# Patient Record
Sex: Female | Born: 1995 | Race: White | Hispanic: No | Marital: Single | Smoking: Never smoker
Health system: Southern US, Community
[De-identification: ages and names within clinical notes are randomized; demographics above are authoritative.]

## PROBLEM LIST (undated history)

## (undated) DIAGNOSIS — R4020 Unspecified coma: Secondary | ICD-10-CM

## (undated) HISTORY — PX: NO PAST SURGERIES: SHX2092

## (undated) HISTORY — PX: DILATION AND CURETTAGE, DIAGNOSTIC / THERAPEUTIC: SUR384

## (undated) HISTORY — DX: Unspecified coma: R40.20

---
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
## 2018-08-10 DIAGNOSIS — Z202 Contact with and (suspected) exposure to infections with a predominantly sexual mode of transmission: Secondary | ICD-10-CM | POA: Diagnosis not present

## 2018-08-25 DIAGNOSIS — J029 Acute pharyngitis, unspecified: Secondary | ICD-10-CM | POA: Diagnosis not present

## 2019-02-13 DIAGNOSIS — N390 Urinary tract infection, site not specified: Secondary | ICD-10-CM | POA: Diagnosis not present

## 2019-02-13 DIAGNOSIS — N941 Unspecified dyspareunia: Secondary | ICD-10-CM | POA: Diagnosis not present

## 2019-03-06 ENCOUNTER — Ambulatory Visit: Payer: BC Managed Care – PPO | Attending: Internal Medicine

## 2019-03-06 DIAGNOSIS — Z20822 Contact with and (suspected) exposure to covid-19: Secondary | ICD-10-CM

## 2019-03-07 LAB — NOVEL CORONAVIRUS, NAA: SARS-CoV-2, NAA: DETECTED — AB

## 2019-03-20 ENCOUNTER — Inpatient Hospital Stay (HOSPITAL_COMMUNITY)
Admission: AD | Admit: 2019-03-20 | Discharge: 2019-03-20 | Disposition: A | Discharge status: Home / Self Care | Payer: BC Managed Care – PPO | Attending: Obstetrics and Gynecology | Admitting: Obstetrics and Gynecology

## 2019-03-20 ENCOUNTER — Other Ambulatory Visit: Payer: Self-pay

## 2019-03-20 ENCOUNTER — Encounter (HOSPITAL_COMMUNITY): Payer: Self-pay | Admitting: Obstetrics and Gynecology

## 2019-03-20 ENCOUNTER — Inpatient Hospital Stay (HOSPITAL_COMMUNITY): Payer: BC Managed Care – PPO

## 2019-03-20 DIAGNOSIS — Z3201 Encounter for pregnancy test, result positive: Secondary | ICD-10-CM | POA: Diagnosis not present

## 2019-03-20 DIAGNOSIS — O99891 Other specified diseases and conditions complicating pregnancy: Secondary | ICD-10-CM | POA: Diagnosis not present

## 2019-03-20 DIAGNOSIS — T7421XA Adult sexual abuse, confirmed, initial encounter: Secondary | ICD-10-CM

## 2019-03-20 DIAGNOSIS — O26891 Other specified pregnancy related conditions, first trimester: Secondary | ICD-10-CM

## 2019-03-20 DIAGNOSIS — R109 Unspecified abdominal pain: Secondary | ICD-10-CM | POA: Diagnosis not present

## 2019-03-20 DIAGNOSIS — O219 Vomiting of pregnancy, unspecified: Secondary | ICD-10-CM | POA: Insufficient documentation

## 2019-03-20 DIAGNOSIS — R1033 Periumbilical pain: Secondary | ICD-10-CM | POA: Diagnosis not present

## 2019-03-20 DIAGNOSIS — N926 Irregular menstruation, unspecified: Secondary | ICD-10-CM | POA: Diagnosis not present

## 2019-03-20 DIAGNOSIS — Z3A08 8 weeks gestation of pregnancy: Secondary | ICD-10-CM | POA: Insufficient documentation

## 2019-03-20 DIAGNOSIS — Z3491 Encounter for supervision of normal pregnancy, unspecified, first trimester: Secondary | ICD-10-CM

## 2019-03-20 DIAGNOSIS — O9A411 Sexual abuse complicating pregnancy, first trimester: Secondary | ICD-10-CM | POA: Diagnosis not present

## 2019-03-20 LAB — CBC
HCT: 34.7 % — ABNORMAL LOW (ref 36.0–46.0)
Hemoglobin: 11.4 g/dL — ABNORMAL LOW (ref 12.0–15.0)
MCH: 28 pg (ref 26.0–34.0)
MCHC: 32.9 g/dL (ref 30.0–36.0)
MCV: 85.3 fL (ref 80.0–100.0)
Platelets: 220 10*3/uL (ref 150–400)
RBC: 4.07 MIL/uL (ref 3.87–5.11)
RDW: 13.4 % (ref 11.5–15.5)
WBC: 7.8 10*3/uL (ref 4.0–10.5)
nRBC: 0 % (ref 0.0–0.2)

## 2019-03-20 LAB — URINALYSIS, ROUTINE W REFLEX MICROSCOPIC
Bilirubin Urine: NEGATIVE
Glucose, UA: NEGATIVE mg/dL
Hgb urine dipstick: NEGATIVE
Ketones, ur: NEGATIVE mg/dL
Nitrite: NEGATIVE
Protein, ur: NEGATIVE mg/dL
Specific Gravity, Urine: 1.015 (ref 1.005–1.030)
pH: 6 (ref 5.0–8.0)

## 2019-03-20 LAB — ABO/RH: ABO/RH(D): O POS

## 2019-03-20 LAB — POCT PREGNANCY, URINE: Preg Test, Ur: POSITIVE — AB

## 2019-03-20 LAB — WET PREP, GENITAL
Clue Cells Wet Prep HPF POC: NONE SEEN
Sperm: NONE SEEN
Trich, Wet Prep: NONE SEEN
Yeast Wet Prep HPF POC: NONE SEEN

## 2019-03-20 LAB — HCG, QUANTITATIVE, PREGNANCY: hCG, Beta Chain, Quant, S: 79667 m[IU]/mL — ABNORMAL HIGH (ref <5)

## 2019-03-20 LAB — HIV ANTIBODY (ROUTINE TESTING W REFLEX): HIV Screen 4th Generation wRfx: NONREACTIVE

## 2019-03-20 MED ORDER — ONDANSETRON HCL 4 MG PO TABS: 4.0000 mg | ORAL_TABLET | Freq: Three times a day (TID) | ORAL | 0 refills | Status: DC | PRN

## 2019-03-20 NOTE — MAU Note (Signed)
Pt presents to MAU with c/o lower abdominal pain that started x 1 week ago. She is also complaining of nausea. LMP-01/21/2019. She was [employer] in clinic and had +HPT today.

## 2019-03-20 NOTE — Discharge Instructions (Signed)
[place] for [employer] at [facility]       Phone: [phone]  [facility] for [employer] at [facility]   Phone: [place] for [employer] at [place]  Phone: [place] for [employer] at [facility]  Phone: [place] for [employer] at [place]  Phone: [place] for [place] at [facility]   Phone: [facility]       Phone: [phone]  [facility]    Phone: [phone]   [facility]    Phone: [phone]  [facility]    Phone: [place]    Phone: [phone]  [place] Department-Family Planning       Phone: [phone]   [place] Department-Maternity  Phone: [place]    Phone: [phone]  [facility]   Phone: [phone]  [facility]      Phone: [phone]  [facility]    Phone: [phone]   Psychiatric Services  [facility] of Care  [address] [place], [place]  [place] [address], [place], [place] [phone] or [phone] [email or web address]  [facility], 8:30-5:00 [address], [place], [place] [email or web address]  *Bring your own interpreter at 1st visit  [place] [address], [place], [place], [place] [email or web address]   Psychotherapeutic Services/ACT Services  [address] [place], [place]  [facility] Walk-in Mon-Fri, 8am-3pm [address] [place], [place] [email or web address]  [place], [place], [place]  [place] [address], [place], [place]  [facility] of the [place] [address] [place], [place] que hablen espanol, favor comunicarse con el Sr. [place], extension [phone] o la [address], extension [place] para hacer una cita.   [facility] [place] (Habla Espanol)  [facility] Counseling [place], [place], [place]  [place], [place], [place]   [facility]aling [facility] Empowering All Survivors)  [address] [place], [place], [place] [email or web address]  *Uninsured and underinsured, ages 19-64  [facility] [place], [place], [place] ([place])  [facility] [address], [place], [place] (Habla Espanol)  [facility] [address] [place], [place] Cinda Quest)  [place] 8:30am-8:00pm/ Fri 8:30-7:00pm [address] [place], [place] (3rd floor, located at corner of [address] and [address]) Call [phone] to schedule an appointment [email or web address]  [facility] [address], [place], [place]  [place], [place], [place]  [place] ([place]) [phone] or [email or web address] [address] [place], [place], [place] [address] * Recovery support and educational programs, including recovery skills classes, support groups, and one-on-one sessions with [place] Certified Peer Support Specialists.    NAMI ([place]) [place]: [phone] * Family and [place] at [phone] for more information * Family to [employer] and Basics Class : enroll online or email Mellody Dance at [email or web address]  * Monthly educational  meetings, contact Dwain Sarna at [phone] [email or web address]    24- Hour Availability:  [facility] [phone] or [phone]  * Family Service of the [employer] (Domestic Violence, Rape, Victim Assistance) Line [phone]  Vesta Mixer [phone] or [phone]  * [facility] [employer]  [phone] [phone] hours)  *[facility] Mobile Crisis Unit [phone]  *[place] National Suicide Hotline [phone]
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
## 2019-03-21 LAB — GC/CHLAMYDIA PROBE AMP (~~LOC~~) NOT AT ARMC
Chlamydia: NEGATIVE
Comment: NEGATIVE
Comment: NORMAL
Neisseria Gonorrhea: NEGATIVE

## 2019-04-24 DIAGNOSIS — H6090 Unspecified otitis externa, unspecified ear: Secondary | ICD-10-CM | POA: Diagnosis not present

## 2019-04-24 DIAGNOSIS — H6120 Impacted cerumen, unspecified ear: Secondary | ICD-10-CM | POA: Diagnosis not present

## 2019-07-08 DIAGNOSIS — Z202 Contact with and (suspected) exposure to infections with a predominantly sexual mode of transmission: Secondary | ICD-10-CM | POA: Diagnosis not present

## 2019-07-08 DIAGNOSIS — N760 Acute vaginitis: Secondary | ICD-10-CM | POA: Diagnosis not present

## 2019-08-21 ENCOUNTER — Encounter: Payer: BC Managed Care – PPO | Admitting: Obstetrics and Gynecology

## 2019-09-19 DIAGNOSIS — H6120 Impacted cerumen, unspecified ear: Secondary | ICD-10-CM | POA: Diagnosis not present

## 2019-10-08 ENCOUNTER — Ambulatory Visit
Admission: EM | Admit: 2019-10-08 | Discharge: 2019-10-08 | Disposition: A | Discharge status: Home / Self Care | Payer: BC Managed Care – PPO | Attending: Emergency Medicine | Admitting: Emergency Medicine

## 2019-10-08 ENCOUNTER — Other Ambulatory Visit: Payer: Self-pay

## 2019-10-08 ENCOUNTER — Ambulatory Visit: Payer: Self-pay

## 2019-10-08 DIAGNOSIS — Z1152 Encounter for screening for COVID-19: Secondary | ICD-10-CM | POA: Diagnosis not present

## 2019-10-08 NOTE — ED Triage Notes (Signed)
Pt here for covid exposure; denies sx  

## 2019-10-09 LAB — NOVEL CORONAVIRUS, NAA: SARS-CoV-2, NAA: NOT DETECTED

## 2019-10-17 ENCOUNTER — Ambulatory Visit
Admission: EM | Admit: 2019-10-17 | Discharge: 2019-10-17 | Disposition: A | Discharge status: Home / Self Care | Payer: BC Managed Care – PPO | Attending: Physician Assistant | Admitting: Physician Assistant

## 2019-10-17 DIAGNOSIS — Z113 Encounter for screening for infections with a predominantly sexual mode of transmission: Secondary | ICD-10-CM | POA: Diagnosis not present

## 2019-10-17 NOTE — Discharge Instructions (Signed)
Testing sent, you will be contacted with any positive results that requires further treatment. Monitor for any worsening of symptoms, fever, abdominal pain, nausea, vomiting, to follow up for reevaluation.

## 2019-10-17 NOTE — ED Provider Notes (Signed)
[facility]    CSN: [id number] Arrival date & time: 10/17/19  1657      History   Chief Complaint Chief Complaint  Patient presents with  . SEXUALLY TRANSMITTED DISEASE    HPI Lisa Walton is a 24 y.o. female.   24 year old female comes in for STD testing. She is asymptomatic. Denies vaginal discharge, itching, spotting. Denies urinary symptoms such as frequency, dysuria, hematuria. Denies fever, chills, body aches. Denies abdominal pain, nausea, vomiting. LMP 10/08/2019. No known exposures.       History reviewed. No pertinent past medical history.  There are no problems to display for this patient.   Past Surgical History:  Procedure Laterality Date  . NO PAST SURGERIES      OB History    Gravida  1   Para      Term      Preterm      AB      Living        SAB      TAB      Ectopic      Multiple      Live Births               Home Medications    Prior to Admission medications   Not on File    Family History History reviewed. No pertinent family history.  Social History Social History   Tobacco Use  . Smoking status: Never Smoker  . Smokeless tobacco: Never Used  Vaping Use  . Vaping Use: Never used  Substance Use Topics  . Alcohol use: Not Currently  . Drug use: Never     Allergies   Patient has no known allergies.   Review of Systems Review of Systems  Reason unable to perform ROS: See HPI as above.     Physical Exam Triage Vital Signs ED Triage Vitals  Enc Vitals Group     BP 10/17/19 1835 118/81     Pulse Rate 10/17/19 1835 74     Resp 10/17/19 1835 16     Temp 10/17/19 1835 98 F (36.7 C)     Temp Source 10/17/19 1835 Oral     SpO2 10/17/19 1835 99 %     Weight --      Height --      Head Circumference --      Peak Flow --      Pain Score 10/17/19 1848 0     Pain Loc --      Pain Edu? --      Excl. in GC? --    No data found.  Updated Vital Signs BP 118/81 (BP Location: Left Arm)    Pulse 74   Temp 98 F (36.7 C) (Oral)   Resp 16   LMP 10/08/2019   SpO2 99%   Breastfeeding No Comment: terminated pregnancy  Physical Exam Constitutional:      General: She is not in acute distress.    Appearance: Normal appearance. She is well-developed. She is not toxic-appearing or diaphoretic.  HENT:     Head: Normocephalic and atraumatic.  Eyes:     Conjunctiva/sclera: Conjunctivae normal.     Pupils: Pupils are equal, round, and reactive to light.  Pulmonary:     Effort: Pulmonary effort is normal. No respiratory distress.  Musculoskeletal:     Cervical back: Normal range of motion and neck supple.  Skin:    General: Skin is warm and dry.  Neurological:  Mental Status: She is alert and oriented to person, place, and time.      UC Treatments / Results  Labs (all labs ordered are listed, but only abnormal results are displayed) Labs Reviewed  HIV ANTIBODY (ROUTINE TESTING W REFLEX)  RPR  CERVICOVAGINAL ANCILLARY ONLY    EKG   Radiology No results found.  Procedures Procedures (including critical care time)  Medications Ordered in UC Medications - No data to display  Initial Impression / Assessment and Plan / UC Course  I have reviewed the triage vital signs and the nursing notes.  Pertinent labs & imaging results that were available during my care of the patient were reviewed by me and considered in my medical decision making (see chart for details).    Cytology, RPR, HIV sent, patient will be contacted with any positive results that require additional treatment. Return precautions given.   Final Clinical Impressions(s) / UC Diagnoses   Final diagnoses:  Screening for STD (sexually transmitted disease)    ED Prescriptions    None     PDMP not reviewed this encounter.   Belinda Fisher, PA-C 10/17/19 1906

## 2019-10-17 NOTE — ED Triage Notes (Signed)
Pt requesting STD testing. Denies sx's. Denies un protective intercourse.

## 2019-10-19 LAB — HIV ANTIBODY (ROUTINE TESTING W REFLEX): HIV Screen 4th Generation wRfx: NONREACTIVE

## 2019-10-19 LAB — RPR: RPR Ser Ql: NONREACTIVE

## 2019-10-21 LAB — CERVICOVAGINAL ANCILLARY ONLY
Chlamydia: NEGATIVE
Comment: NEGATIVE
Comment: NEGATIVE
Comment: NORMAL
Neisseria Gonorrhea: NEGATIVE
Trichomonas: NEGATIVE

## 2019-11-25 ENCOUNTER — Encounter: Payer: Self-pay | Admitting: Obstetrics and Gynecology

## 2019-11-25 ENCOUNTER — Other Ambulatory Visit (HOSPITAL_COMMUNITY)
Admission: RE | Admit: 2019-11-25 | Discharge: 2019-11-25 | Disposition: A | Discharge status: Home / Self Care | Payer: BC Managed Care – PPO | Source: Ambulatory Visit | Attending: Obstetrics and Gynecology | Admitting: Obstetrics and Gynecology

## 2019-11-25 ENCOUNTER — Other Ambulatory Visit: Payer: Self-pay

## 2019-11-25 ENCOUNTER — Ambulatory Visit (INDEPENDENT_AMBULATORY_CARE_PROVIDER_SITE_OTHER): Payer: BC Managed Care – PPO | Admitting: Obstetrics and Gynecology

## 2019-11-25 VITALS — BP 119/69 | HR 83 | Wt 159.0 lb

## 2019-11-25 DIAGNOSIS — N898 Other specified noninflammatory disorders of vagina: Secondary | ICD-10-CM | POA: Insufficient documentation

## 2019-11-25 DIAGNOSIS — B373 Candidiasis of vulva and vagina: Secondary | ICD-10-CM | POA: Diagnosis not present

## 2019-11-25 DIAGNOSIS — Z124 Encounter for screening for malignant neoplasm of cervix: Secondary | ICD-10-CM

## 2019-11-25 DIAGNOSIS — Z3009 Encounter for other general counseling and advice on contraception: Secondary | ICD-10-CM | POA: Diagnosis not present

## 2019-11-25 DIAGNOSIS — Z113 Encounter for screening for infections with a predominantly sexual mode of transmission: Secondary | ICD-10-CM

## 2019-11-25 NOTE — Patient Instructions (Signed)
Intrauterine Device Information An intrauterine device (IUD) is a medical device that is inserted in the uterus to prevent pregnancy. It is a small, T-shaped device that has one or two nylon strings hanging down from it. The strings hang out of the lower part of the uterus (cervix) to allow for future IUD removal. There are two types of IUDs available:  Hormone IUD. This type of IUD is made of plastic and contains the hormone progestin (synthetic progesterone). A hormone IUD may last 3-5 years.  Copper IUD. This type of IUD has copper wire wrapped around it. A copper IUD may last up to 10 years. How is an IUD inserted? An IUD is inserted through the vagina and placed into the uterus with a minor medical procedure. The exact procedure for IUD insertion may vary among health care providers and hospitals. How does an IUD work? Synthetic progesterone in a hormonal IUD prevents pregnancy by:  Thickening cervical mucus to prevent sperm from entering the uterus.  Thinning the uterine lining to prevent a fertilized egg from being implanted there. Copper in a copper IUD prevents pregnancy by making the uterus and fallopian tubes produce a fluid that kills sperm. What are the advantages of an IUD? Advantages of either type of IUD  It is highly effective in preventing pregnancy.  It is reversible. You can become pregnant shortly after the IUD is removed.  It is low-maintenance and can stay in place for a long time.  There are no estrogen-related side effects.  It can be used when breastfeeding.  It is not associated with weight gain.  It can be inserted right after childbirth, an abortion, or a miscarriage. Advantages of a hormone IUD  If it is inserted within 7 days of your period starting, it works right after it is inserted. If the hormone IUD is inserted at any other time in your cycle, you will need to use a backup method of birth control for 7 days after insertion.  It can make  menstrual periods lighter.  It can reduce menstrual cramping.  It can be used for 3-5 years. Advantages of a copper IUD  It works right after it is inserted.  It can be used as a form of emergency birth control if it is inserted within 5 days after having unprotected sex.  It does not interfere with your body's natural hormones.  It can be used for 10 years. What are the disadvantages of an IUD?  An IUD may cause irregular menstrual bleeding for a period of time after insertion.  You may have pain during insertion and have cramping and vaginal bleeding after insertion.  An IUD may cut the uterus (uterine perforation) when it is inserted. This is rare.  An IUD may cause pelvic inflammatory disease (PID), which is an infection in the uterus and fallopian tubes. This is rare, and it usually happens during the first 20 days after the IUD is inserted.  A copper IUD can make your menstrual flow heavier and more painful. How is an IUD removed?  You will lie on your back with your knees bent and your feet in footrests (stirrups).  A device will be inserted into your vagina to spread apart the vaginal walls (speculum). This will allow your health care provider to see the strings attached to the IUD.  Your health care provider will use a small instrument (forceps) to grasp the IUD strings and pull firmly until the IUD is removed. You may have some discomfort   when the IUD is removed. Your health care provider may recommend taking over-the-counter pain relievers, such as ibuprofen, before the procedure. You may also have minor spotting for a few days after the procedure. The exact procedure for IUD removal may vary among health care providers and hospitals. Is the IUD right for me? Your health care provider will make sure you are a good candidate for an IUD and will discuss the advantages, disadvantages, and possible side effects with you. Summary  An intrauterine device (IUD) is a medical  device that is inserted in the uterus to prevent pregnancy. It is a small, T-shaped device that has one or two nylon strings hanging down from it.  A hormone IUD contains the hormone progestin (synthetic progesterone). A copper IUD has copper wire wrapped around it.  Synthetic progesterone in a hormone IUD prevents pregnancy by thickening cervical mucus and thinning the walls of the uterus. Copper in a copper IUD prevents pregnancy by making the uterus and fallopian tubes produce a fluid that kills sperm.  A hormone IUD can be left in place for 3-5 years. A copper IUD can be left in place for up to 10 years.  An IUD is inserted and removed by a health care provider. You may feel some pain during insertion and removal. Your health care provider may recommend taking over-the-counter pain medicine, such as ibuprofen, before an IUD procedure. This information is not intended to replace advice given to you by your health care provider. Make sure you discuss any questions you have with your health care provider. Document Revised: 01/06/2017 Document Reviewed: 02/23/2016 Elsevier Patient Education  2020 [employer].  Go to bedsider.org for more information!  Contraception Choices Contraception, also called birth control, refers to methods or devices that prevent pregnancy. Hormonal methods Contraceptive implant A contraceptive implant is a thin, plastic tube that contains a hormone. It is inserted into the upper part of the arm. It can remain in place for up to 3 years. Progestin-only injections Progestin-only injections are injections of progestin, a synthetic form of the hormone progesterone. They are given every 3 months by a health care provider. Birth control pills Birth control pills are pills that contain hormones that prevent pregnancy. They must be taken once a day, preferably at the same time each day. Birth control patch The birth control patch contains hormones that prevent pregnancy.  It is placed on the skin and must be changed once a week for three weeks and removed on the fourth week. A prescription is needed to use this method of contraception. Vaginal ring A vaginal ring contains hormones that prevent pregnancy. It is placed in the vagina for three weeks and removed on the fourth week. After that, the process is repeated with a new ring. A prescription is needed to use this method of contraception. Emergency contraceptive Emergency contraceptives prevent pregnancy after unprotected sex. They come in pill form and can be taken up to 5 days after sex. They work best the sooner they are taken after having sex. Most emergency contraceptives are available without a prescription. This method should not be used as your only form of birth control. Barrier methods Female condom A female condom is a thin sheath that is worn over the penis during sex. Condoms keep sperm from going inside a woman's body. They can be used with a spermicide to increase their effectiveness. They should be disposed after a single use. Female condom A female condom is a soft, loose-fitting sheath that is  put into the vagina before sex. The condom keeps sperm from going inside a woman's body. They should be disposed after a single use.  Intrauterine contraception Intrauterine device (IUD) An IUD is a T-shaped device that is put in a woman's uterus. There are two types:  Hormone IUD.This type contains progestin, a synthetic form of the hormone progesterone. This type can stay in place for 3-5 years.  Copper IUD.This type is wrapped in copper wire. It can stay in place for 10 years.  Permanent methods of contraception Female tubal ligation In this method, a woman's fallopian tubes are sealed, tied, or blocked during surgery to prevent eggs from traveling to the uterus.  Female sterilization This is a procedure to tie off the tubes that carry sperm (vasectomy). After the procedure, the man can still ejaculate  fluid (semen).  Summary  Contraception, also called birth control, means methods or devices that prevent pregnancy.  Hormonal methods of contraception include implants, injections, pills, patches, vaginal rings, and emergency contraceptives.  Barrier methods of contraception can include female condoms, female condoms, diaphragms, cervical caps, sponges, and spermicides.  There are two types of IUDs (intrauterine devices). An IUD can be put in a woman's uterus to prevent pregnancy for 3-5 years.  Permanent sterilization can be done through a procedure for males, females, or both. This information is not intended to replace advice given to you by your health care provider. Make sure you discuss any questions you have with your health care provider. Document Released: 01/24/2005 Document Revised: 02/27/2016 Document Reviewed: 02/27/2016 Elsevier Interactive Patient Education  [employer].    Use the following websites (and others) to help learn more about your contraception options and find the method that is right for you!  - The Centers for Disease Control [employer]) website: TransferLive.se  - [facility] website: https://www.[facility]parenthood.org/learn/birth-control  - Bedsider.org: https://www.bedsider.org/methods

## 2019-11-25 NOTE — Progress Notes (Signed)
   GYNECOLOGY OFFICE NOTE  History:  24 y.o. G1P0010 here today for pap smear, STI screen. No acute concerns or exposures, just wants to be checked. On OCPs, interested in other methods of contraception.  History reviewed. No pertinent past medical history.  Past Surgical History:  Procedure Laterality Date  . DILATION AND CURETTAGE, DIAGNOSTIC / THERAPEUTIC    . NO PAST SURGERIES       Current Outpatient Medications:  .  NORETHINDRON-ETHINYL ESTRAD-FE PO, Take 1 tablet by mouth., Disp: , Rfl:   The following portions of the patient's history were reviewed and updated as appropriate: allergies, current medications, past family history, past medical history, past social history, past surgical history and problem list.   Review of Systems:  Pertinent items noted in HPI and remainder of comprehensive ROS otherwise negative.   Objective:  Physical Exam BP 119/69   Pulse 83   Wt 159 lb (72.1 kg)   LMP 01/21/2019  CONSTITUTIONAL: Well-developed, well-nourished female in no acute distress.  HENT:  Normocephalic, atraumatic. External right and left ear normal. Oropharynx is clear and moist EYES: Conjunctivae and EOM are normal. Pupils are equal, round, and reactive to light. No scleral icterus.  NECK: Normal range of motion, supple, no masses SKIN: Skin is warm and dry. No rash noted. Not diaphoretic. No erythema. No pallor. NEUROLOGIC: Alert and oriented to person, place, and time. Normal reflexes, muscle tone coordination. No cranial nerve deficit noted. PSYCHIATRIC: Normal mood and affect. Normal behavior. Normal judgment and thought content. CARDIOVASCULAR: Normal heart rate noted RESPIRATORY: Effort normal, no problems with respiration noted ABDOMEN: Soft, no distention noted.   PELVIC: Normal appearing external genitalia; normal appearing vaginal mucosa and cervix.  No abnormal discharge noted.  Pap smear obtained.  pelvic cultures obtained.  MUSCULOSKELETAL: Normal range of  motion. No edema noted.  Exam done with chaperone present.  Labs and Imaging No results found.  Assessment & Plan:  1. Cervical cancer screening - Cytology - PAP( [place]) - Cervicovaginal ancillary only( [place])  2. Routine screening for STI (sexually transmitted infection) Vaginal swabs - Hepatitis B surface antigen - HIV Antibody (routine testing w rflx) - Hepatitis C antibody - RPR  3. Encounter for counseling regarding contraception Reviewed options for contraception including IUD, gave info, she is interested in IUD, will return for placement if she desires    Routine preventative health maintenance measures emphasized. Please refer to After Visit Summary for other counseling recommendations.   Return if symptoms worsen or fail to improve.  Total face-to-face time with patient: 22 minutes. Over 50% of encounter was spent on counseling and coordination of care.  Baldemar Lenis, M.D. [facility]ending [facility] [employer])

## 2019-11-26 LAB — CERVICOVAGINAL ANCILLARY ONLY
Bacterial Vaginitis (gardnerella): NEGATIVE
Candida Glabrata: NEGATIVE
Candida Vaginitis: POSITIVE — AB
Comment: NEGATIVE
Comment: NEGATIVE
Comment: NEGATIVE
Comment: NEGATIVE
Trichomonas: NEGATIVE

## 2019-11-26 LAB — RPR: RPR Ser Ql: NONREACTIVE

## 2019-11-26 LAB — HEPATITIS B SURFACE ANTIGEN: Hepatitis B Surface Ag: NEGATIVE

## 2019-11-26 LAB — HEPATITIS C ANTIBODY: Hep C Virus Ab: <0.1 s/co ratio (ref 0.0–0.9)

## 2019-11-26 LAB — HIV ANTIBODY (ROUTINE TESTING W REFLEX): HIV Screen 4th Generation wRfx: NONREACTIVE

## 2019-11-27 LAB — CYTOLOGY - PAP
Chlamydia: NEGATIVE
Comment: NEGATIVE
Comment: NORMAL
Diagnosis: NEGATIVE
Diagnosis: REACTIVE
Neisseria Gonorrhea: NEGATIVE

## 2019-11-27 MED ORDER — FLUCONAZOLE 150 MG PO TABS: 150.0000 mg | ORAL_TABLET | Freq: Once | ORAL | 0 refills | Status: AC

## 2019-11-27 NOTE — Addendum Note (Signed)
Addended by: Leroy Libman on: 11/27/2019 01:09 PM   Modules accepted: Orders

## 2019-12-04 ENCOUNTER — Ambulatory Visit (INDEPENDENT_AMBULATORY_CARE_PROVIDER_SITE_OTHER): Payer: BC Managed Care – PPO | Admitting: Otolaryngology

## 2019-12-04 ENCOUNTER — Encounter (INDEPENDENT_AMBULATORY_CARE_PROVIDER_SITE_OTHER): Payer: Self-pay | Admitting: Otolaryngology

## 2019-12-04 ENCOUNTER — Other Ambulatory Visit: Payer: Self-pay

## 2019-12-04 VITALS — Temp 97.7°F

## 2019-12-04 DIAGNOSIS — H6123 Impacted cerumen, bilateral: Secondary | ICD-10-CM

## 2019-12-04 DIAGNOSIS — H60311 Diffuse otitis externa, right ear: Secondary | ICD-10-CM | POA: Diagnosis not present

## 2019-12-04 NOTE — Progress Notes (Signed)
HPI: Lisa Walton is a 24 y.o. female who presents for evaluation of right ear pain drainage and decreased hearing.  She can hear her heartbeat in the right ear.  This has been going on for about 2 weeks now.  She went flying recently and this made it worse.  She was seen by her medical doctor and was treated with wax removal eardrops but this also made it worse.  No past medical history on file. Past Surgical History:  Procedure Laterality Date  . DILATION AND CURETTAGE, DIAGNOSTIC / THERAPEUTIC    . NO PAST SURGERIES     Social History   Socioeconomic History  . Marital status: Single    Spouse name: Not on file  . Number of children: Not on file  . Years of education: Not on file  . Highest education level: Not on file  Occupational History  . Not on file  Tobacco Use  . Smoking status: Never Smoker  . Smokeless tobacco: Never Used  Vaping Use  . Vaping Use: Never used  Substance and Sexual Activity  . Alcohol use: Not Currently  . Drug use: Never  . Sexual activity: Yes    Birth control/protection: Pill  Other Topics Concern  . Not on file  Social History Narrative  . Not on file   Social Determinants of Health   Financial Resource Strain:   . Difficulty of Paying Living Expenses: Not on file  Food Insecurity:   . Worried About Programme researcher, broadcasting/film/video in the Last Year: Not on file  . Ran Out of Food in the Last Year: Not on file  Transportation Needs:   . Lack of Transportation (Medical): Not on file  . Lack of Transportation (Non-Medical): Not on file  Physical Activity:   . Days of Exercise per Week: Not on file  . Minutes of Exercise per Session: Not on file  Stress:   . Feeling of Stress : Not on file  Social Connections:   . Frequency of Communication with Friends and Family: Not on file  . Frequency of Social Gatherings with Friends and Family: Not on file  . Attends Religious Services: Not on file  . Active Member of Clubs or Organizations: Not on file  .  Attends Banker Meetings: Not on file  . Marital Status: Not on file   No family history on file. No Known Allergies Prior to Admission medications   Medication Sig Start Date End Date Taking? Authorizing Provider  NORETHINDRON-ETHINYL ESTRAD-FE PO Take 1 tablet by mouth.   Yes [provider]     Positive ROS: Otherwise negative  All other systems have been reviewed and were otherwise negative with the exception of those mentioned in the HPI and as above.  Physical Exam: Constitutional: Alert, well-appearing, no acute distress Ears: External ears without lesions or tenderness.  Left ear canal had minimal wax that was removed with suction.  Ear canal and TM were otherwise clear.  However on the right side her right ear canal was totally occluded with wax and debris that was cleaned with forceps and suction.  She had inflammatory changes of the medial portion of the ear canal with some granulation tissue on the TM posteriorly inferiorly.  After copiously cleaning the ear canal with suction I applied gentian violet, Ciprodex and CSF powder to the right ear.  After cleaning the ear canal hearing was symmetric with the 512 fork. Nasal: External nose without lesions. Septum midline.. Clear nasal  passages Oral: Lips and gums without lesions. Tongue and palate mucosa without lesions. Posterior oropharynx clear. Neck: No palpable adenopathy or masses Respiratory: Breathing comfortably  Skin: No facial/neck lesions or rash noted.  Cerumen impaction removal  Date/Time: 12/04/2019 1:00 PM Performed by: Drema Halon, MD Authorized by: Drema Halon, MD   Consent:    Consent obtained:  Verbal   Consent given by:  Patient   Risks discussed:  Pain and bleeding Procedure details:    Location:  L ear and R ear   Procedure type: curette and suction   Post-procedure details:    Inspection:  TM intact   Hearing quality:  Improved   Patient tolerance of  procedure:  Tolerated well, no immediate complications Comments:     Left ear canal left TM are clear.  Right ear canal showed inflammatory changes with inflammation involving the right TM with a small amount of granulation tissue.  After cleaning the right ear canal I applied gentian violet, Ciprodex and CSF powder.    Assessment: Patient with acute right external otitis with obstruction of the ear canal with debris.  Plan: Recommend keeping the ear dry and will have her follow-up in 7 to 9 days for recheck. Gave her a prescription for Cortisporin drops to use if she develops any ear pain or drainage prior to follow-up visit.  Narda Bonds, MD

## 2019-12-11 ENCOUNTER — Ambulatory Visit (INDEPENDENT_AMBULATORY_CARE_PROVIDER_SITE_OTHER): Payer: BC Managed Care – PPO | Admitting: Otolaryngology

## 2019-12-13 ENCOUNTER — Encounter (INDEPENDENT_AMBULATORY_CARE_PROVIDER_SITE_OTHER): Payer: Self-pay | Admitting: Otolaryngology

## 2019-12-13 ENCOUNTER — Other Ambulatory Visit: Payer: Self-pay

## 2019-12-13 ENCOUNTER — Ambulatory Visit (INDEPENDENT_AMBULATORY_CARE_PROVIDER_SITE_OTHER): Payer: BC Managed Care – PPO | Admitting: Otolaryngology

## 2019-12-13 VITALS — Temp 97.2°F

## 2019-12-13 DIAGNOSIS — H60311 Diffuse otitis externa, right ear: Secondary | ICD-10-CM

## 2019-12-13 NOTE — Progress Notes (Signed)
HPI: Lisa Walton is a 24 y.o. female who returns today for evaluation of severe right external otitis.  She is doing much better today with no pain or discomfort and better hearing in both ears..  No past medical history on file. Past Surgical History:  Procedure Laterality Date  . DILATION AND CURETTAGE, DIAGNOSTIC / THERAPEUTIC    . NO PAST SURGERIES     Social History   Socioeconomic History  . Marital status: Single    Spouse name: Not on file  . Number of children: Not on file  . Years of education: Not on file  . Highest education level: Not on file  Occupational History  . Not on file  Tobacco Use  . Smoking status: Never Smoker  . Smokeless tobacco: Never Used  Vaping Use  . Vaping Use: Never used  Substance and Sexual Activity  . Alcohol use: Not Currently  . Drug use: Never  . Sexual activity: Yes    Birth control/protection: Pill  Other Topics Concern  . Not on file  Social History Narrative  . Not on file   Social Determinants of Health   Financial Resource Strain:   . Difficulty of Paying Living Expenses: Not on file  Food Insecurity:   . Worried About Programme researcher, broadcasting/film/video in the Last Year: Not on file  . Ran Out of Food in the Last Year: Not on file  Transportation Needs:   . Lack of Transportation (Medical): Not on file  . Lack of Transportation (Non-Medical): Not on file  Physical Activity:   . Days of Exercise per Week: Not on file  . Minutes of Exercise per Session: Not on file  Stress:   . Feeling of Stress : Not on file  Social Connections:   . Frequency of Communication with Friends and Family: Not on file  . Frequency of Social Gatherings with Friends and Family: Not on file  . Attends Religious Services: Not on file  . Active Member of Clubs or Organizations: Not on file  . Attends Banker Meetings: Not on file  . Marital Status: Not on file   No family history on file. No Known Allergies Prior to Admission medications    Medication Sig Start Date End Date Taking? Authorizing Provider  NORETHINDRON-ETHINYL ESTRAD-FE PO Take 1 tablet by mouth.   Yes [provider]     Positive ROS: Otherwise negative  All other systems have been reviewed and were otherwise negative with the exception of those mentioned in the HPI and as above.  Physical Exam: Constitutional: Alert, well-appearing, no acute distress Ears: External ears without lesions or tenderness.  Ear canals are clear bilaterally.  TMs are clear bilaterally with good mobility on pneumatic otoscopy.  She still has some gentian violet present in the right ear canal but no clinical evidence of any persistent infection.  Left ear canal had minimal cerumen that was cleaned with suction and the ear canal and TM are clear otherwise..  Nasal: External nose without lesions. Clear nasal passages Oral: Lips and gums without lesions. Tongue and palate mucosa without lesions. Posterior oropharynx clear. Neck: No palpable adenopathy or masses Respiratory: Breathing comfortably  Skin: No facial/neck lesions or rash noted.  Procedures  Assessment: Resolution of right external otitis TMs are clear bilaterally.  Minimal cerumen buildup.  Plan: She will follow-up as needed.   Narda Bonds, MD

## 2020-02-04 DIAGNOSIS — Z20822 Contact with and (suspected) exposure to covid-19: Secondary | ICD-10-CM | POA: Diagnosis not present

## 2020-02-04 DIAGNOSIS — J3489 Other specified disorders of nose and nasal sinuses: Secondary | ICD-10-CM | POA: Diagnosis not present

## 2020-02-14 ENCOUNTER — Ambulatory Visit: Payer: BC Managed Care – PPO

## 2020-02-21 ENCOUNTER — Other Ambulatory Visit: Payer: Self-pay

## 2020-02-21 ENCOUNTER — Ambulatory Visit
Admission: EM | Admit: 2020-02-21 | Discharge: 2020-02-21 | Disposition: A | Discharge status: Home / Self Care | Payer: BC Managed Care – PPO | Attending: Emergency Medicine | Admitting: Emergency Medicine

## 2020-02-21 DIAGNOSIS — H60391 Other infective otitis externa, right ear: Secondary | ICD-10-CM | POA: Diagnosis not present

## 2020-02-21 MED ORDER — NEOMYCIN-POLYMYXIN-HC 3.5-10000-1 OT SUSP: 3.0000 [drp] | Freq: Three times a day (TID) | OTIC | 0 refills | Status: DC

## 2020-02-21 NOTE — ED Provider Notes (Signed)
[facility]    CSN: [id number] Arrival date & time: 02/21/20  1409      History   Chief Complaint Chief Complaint  Patient presents with  . Otalgia    HPI Lisa Walton is a 25 y.o. female  With history of AOE presenting for the same.  Endorsing right ear pain, discharge.  States that she used an ear wax removal kit that was a liquid about 2 weeks ago and has gotten worse since.  Has seen ENT in the past, though it has been years.  Denies tinnitus, dizziness, change in hearing, trauma, bleeding.  History reviewed. No pertinent past medical history.  There are no problems to display for this patient.   Past Surgical History:  Procedure Laterality Date  . DILATION AND CURETTAGE, DIAGNOSTIC / THERAPEUTIC    . NO PAST SURGERIES      OB History    Gravida  1   Para      Term      Preterm      AB  1   Living        SAB      IAB  1   Ectopic      Multiple      Live Births               Home Medications    Prior to Admission medications   Medication Sig Start Date End Date Taking? Authorizing Provider  neomycin-polymyxin-hydrocortisone (CORTISPORIN) 3.5-10000-1 OTIC suspension Place 3 drops into the right ear 3 (three) times daily. 02/21/20  Yes Hall-Potvin, [place], PA-C    Family History History reviewed. No pertinent family history.  Social History Social History   Tobacco Use  . Smoking status: Never Smoker  . Smokeless tobacco: Never Used  Vaping Use  . Vaping Use: Never used  Substance Use Topics  . Alcohol use: Not Currently  . Drug use: Never     Allergies   Patient has no known allergies.   Review of Systems Review of Systems  Constitutional: Negative for fatigue and fever.  HENT: Positive for ear discharge and ear pain. Negative for congestion, dental problem, facial swelling, hearing loss, sinus pain, sore throat, trouble swallowing and voice change.   Eyes: Negative for photophobia, pain and visual  disturbance.  Respiratory: Negative for cough and shortness of breath.   Cardiovascular: Negative for chest pain and palpitations.  Gastrointestinal: Negative for diarrhea and vomiting.  Musculoskeletal: Negative for arthralgias and myalgias.  Neurological: Negative for dizziness and headaches.     Physical Exam Triage Vital Signs ED Triage Vitals  Enc Vitals Group     BP 02/21/20 1422 113/64     Pulse Rate 02/21/20 1422 68     Resp 02/21/20 1422 18     Temp 02/21/20 1422 98 F (36.7 C)     Temp Source 02/21/20 1422 Oral     SpO2 02/21/20 1422 96 %     Weight --      Height --      Head Circumference --      Peak Flow --      Pain Score 02/21/20 1423 1     Pain Loc --      Pain Edu? --      Excl. in [place]? --    No data found.  Updated Vital Signs BP 113/64 (BP Location: Left Arm)   Pulse 68   Temp 98 F (36.7 C) (Oral)   Resp 18  LMP 02/17/2020   SpO2 96%   Visual Acuity Right Eye Distance:   Left Eye Distance:   Bilateral Distance:    Right Eye Near:   Left Eye Near:    Bilateral Near:     Physical Exam Constitutional:      General: She is not in acute distress. HENT:     Head: Normocephalic and atraumatic.     Jaw: There is normal jaw occlusion. No tenderness or pain on movement.     Right Ear: Hearing and tympanic membrane normal. No tenderness. No mastoid tenderness.     Left Ear: Hearing, tympanic membrane, ear canal and external ear normal. No tenderness. No mastoid tenderness.     Ears:     Comments: Right ear with mild tragal tenderness.  EAC moderately edematous with scant discharge.  No bleeding, FB.    Nose: No nasal deformity, septal deviation or nasal tenderness.     Right Turbinates: Not swollen or pale.     Left Turbinates: Not swollen or pale.     Right Sinus: No maxillary sinus tenderness or frontal sinus tenderness.     Left Sinus: No maxillary sinus tenderness or frontal sinus tenderness.     Mouth/Throat:     Lips: Pink. No lesions.      Mouth: Mucous membranes are moist. No injury.     Pharynx: Oropharynx is clear. Uvula midline. No posterior oropharyngeal erythema or uvula swelling.     Comments: no tonsillar exudate or hypertrophy Cardiovascular:     Rate and Rhythm: Normal rate.  Pulmonary:     Effort: Pulmonary effort is normal.  Musculoskeletal:     Cervical back: Normal range of motion and neck supple. No muscular tenderness.  Lymphadenopathy:     Cervical: No cervical adenopathy.  Neurological:     Mental Status: She is alert and oriented to person, place, and time.      UC Treatments / Results  Labs (all labs ordered are listed, but only abnormal results are displayed) Labs Reviewed - No data to display  EKG   Radiology No results found.  Procedures Procedures (including critical care time)  Medications Ordered in UC Medications - No data to display  Initial Impression / Assessment and Plan / UC Course  I have reviewed the triage vital signs and the nursing notes.  Pertinent labs & imaging results that were available during my care of the patient were reviewed by me and considered in my medical decision making (see chart for details).     H&P concerning for acute AOE.  Will treat as below, follow-up with ENT for persistent or worsening symptoms.  Return precautions discussed, pt verbalized understanding and is agreeable to plan. Final Clinical Impressions(s) / UC Diagnoses   Final diagnoses:  Acute infective otitis externa, right     Discharge Instructions     Use eardrops as prescribed for the next week. Return for worsening ear pain, swelling, discharge, bleeding, decreased hearing, development of jaw pain/swelling, fever.  Do NOT use Q-tips as these can cause your ear wax to get stuck, the tips may break off and become a foreign body requiring additional medical care, or puncture your eardrum.  Helpful prevention tip: Use a solution of equal parts isopropyl (rubbing) alcohol  and white vinegar (acetic acid) in both ears after swimming.    ED Prescriptions    Medication Sig Dispense Auth. Provider   neomycin-polymyxin-hydrocortisone (CORTISPORIN) 3.5-10000-1 OTIC suspension Place 3 drops into the right ear 3 (three)  times daily. 10 mL Hall-Potvin, [place], PA-C     PDMP not reviewed this encounter.   Hall-Potvin, [place], [place] 02/21/20 1449

## 2020-02-21 NOTE — Discharge Instructions (Signed)
Use eardrops as prescribed for the next week. Return for worsening ear pain, swelling, discharge, bleeding, decreased hearing, development of jaw pain/swelling, fever.  Do NOT use Q-tips as these can cause your ear wax to get stuck, the tips may break off and become a foreign body requiring additional medical care, or puncture your eardrum.  Helpful prevention tip: Use a solution of equal parts isopropyl (rubbing) alcohol and white vinegar (acetic acid) in both ears after swimming. 

## 2020-02-21 NOTE — ED Triage Notes (Signed)
Pt c/o rt ear pain x1wk after using ear wax removal 2wks ago. States has seen an ENT in the past and said she gets ear wax build up. States getting only fluid out now.

## 2020-03-02 DIAGNOSIS — Z20822 Contact with and (suspected) exposure to covid-19: Secondary | ICD-10-CM | POA: Diagnosis not present

## 2020-03-17 ENCOUNTER — Ambulatory Visit (INDEPENDENT_AMBULATORY_CARE_PROVIDER_SITE_OTHER): Payer: BC Managed Care – PPO | Admitting: Otolaryngology

## 2020-03-17 ENCOUNTER — Ambulatory Visit
Admission: EM | Admit: 2020-03-17 | Discharge: 2020-03-17 | Disposition: A | Discharge status: Home / Self Care | Payer: BC Managed Care – PPO | Attending: Internal Medicine | Admitting: Internal Medicine

## 2020-03-17 ENCOUNTER — Other Ambulatory Visit: Payer: Self-pay

## 2020-03-17 DIAGNOSIS — N760 Acute vaginitis: Secondary | ICD-10-CM | POA: Insufficient documentation

## 2020-03-17 LAB — POCT URINALYSIS DIP (MANUAL ENTRY)
Bilirubin, UA: NEGATIVE
Blood, UA: NEGATIVE
Glucose, UA: NEGATIVE mg/dL
Ketones, POC UA: NEGATIVE mg/dL
Leukocytes, UA: NEGATIVE
Nitrite, UA: NEGATIVE
Protein Ur, POC: NEGATIVE mg/dL
Spec Grav, UA: 1.025 (ref 1.010–1.025)
Urobilinogen, UA: 1 E.U./dL
pH, UA: 7 (ref 5.0–8.0)

## 2020-03-17 LAB — POCT URINE PREGNANCY: Preg Test, Ur: NEGATIVE

## 2020-03-17 MED ORDER — NYSTATIN 100000 UNIT/GM EX CREA: TOPICAL_CREAM | CUTANEOUS | 0 refills | Status: DC

## 2020-03-17 NOTE — ED Triage Notes (Signed)
Pt c/o vaginal burning and irritation to outside x3 days. States has had un protective intercourse. Denies vaginal discharge or urinary sx's.

## 2020-03-17 NOTE — Discharge Instructions (Signed)
Your urine test is negative for infection and the pregnancy test is negative I will have you try a cream on the area that bothers you in the mean time until the rest of the test are back.

## 2020-03-17 NOTE — ED Provider Notes (Signed)
[facility]    CSN: [id number] Arrival date & time: 03/17/20  1748      History   Chief Complaint Chief Complaint  Patient presents with  . Vaginal Itching    HPI Lisa Walton is a 25 y.o. female who presents due to having irritation and soreness on the outer central vaginal region. Her sexual partner did not wear a condom when they had sex last week. She has had itching on this area as well. She notices this more when she wipes after voiding Denies abnormal vaginal discharge or UTI symptoms.  LMP 1/10 History reviewed. No pertinent past medical history.  There are no problems to display for this patient.   Past Surgical History:  Procedure Laterality Date  . DILATION AND CURETTAGE, DIAGNOSTIC / THERAPEUTIC    . NO PAST SURGERIES      OB History    Gravida  1   Para      Term      Preterm      AB  1   Living        SAB      IAB  1   Ectopic      Multiple      Live Births               Home Medications    Prior to Admission medications   Medication Sig Start Date End Date Taking? Authorizing Provider  nystatin cream (MYCOSTATIN) Apply to affected area 2 times daily for   days 03/17/20  Yes Rodriguez-Southworth, Nettie Elm, PA-C    Family History History reviewed. No pertinent family history.  Social History Social History   Tobacco Use  . Smoking status: Never Smoker  . Smokeless tobacco: Never Used  Vaping Use  . Vaping Use: Never used  Substance Use Topics  . Alcohol use: Not Currently  . Drug use: Never     Allergies   Patient has no known allergies.   Review of Systems Review of Systems  Genitourinary: Negative for dysuria, frequency, genital sores, pelvic pain, urgency, vaginal discharge and vaginal pain.  Skin: Negative for rash and wound.  Hematological: Negative for adenopathy.     Physical Exam Triage Vital Signs ED Triage Vitals [03/17/20 1802]  Enc Vitals Group     BP 110/72     Pulse Rate 76      Resp 18     Temp 98.3 F (36.8 C)     Temp Source Oral     SpO2 98 %     Weight      Height      Head Circumference      Peak Flow      Pain Score 1     Pain Loc      Pain Edu?      Excl. in [place]?    No data found.  Updated Vital Signs BP 110/72 (BP Location: Left Arm)   Pulse 76   Temp 98.3 F (36.8 C) (Oral)   Resp 18   LMP 02/17/2020   SpO2 98%   Visual Acuity Right Eye Distance:   Left Eye Distance:   Bilateral Distance:    Right Eye Near:   Left Eye Near:    Bilateral Near:     Physical Exam Vitals and nursing note reviewed.  Constitutional:      General: She is not in acute distress.    Appearance: She is not toxic-appearing.  HENT:  Head: Normocephalic.     Right Ear: External ear normal.     Left Ear: External ear normal.  Eyes:     General: No scleral icterus.    Conjunctiva/sclera: Conjunctivae normal.  Pulmonary:     Effort: Pulmonary effort is normal.  Genitourinary:    Comments: Inspection exam revealed mild soreness over clitoris area, but no lesion or abrasions noted. No discharge noted.  Musculoskeletal:        General: Normal range of motion.     Cervical back: Neck supple.  Skin:    General: Skin is warm and dry.     Findings: No lesion or rash.  Neurological:     Mental Status: She is alert and oriented to person, place, and time.     Gait: Gait normal.  Psychiatric:        Mood and Affect: Mood normal.        Behavior: Behavior normal.        Thought Content: Thought content normal.        Judgment: Judgment normal.      UC Treatments / Results  Labs (all labs ordered are listed, but only abnormal results are displayed) Labs Reviewed  POCT URINALYSIS DIP (MANUAL ENTRY)  POCT URINE PREGNANCY    EKG   Radiology No results found.  Procedures Procedures (including critical care time)  Medications Ordered in UC Medications - No data to display  Initial Impression / Assessment and Plan / UC Course  I have reviewed  the triage vital signs and the nursing notes. Vaginitis, possible yeast. I will have her try Nystatin cream until the STD tests are back.  Pertinent labs results that were available during my care of the patient were reviewed by me and considered in my medical decision making (see chart for details).   Final Clinical Impressions(s) / UC Diagnoses   Final diagnoses:  Vaginitis and vulvovaginitis     Discharge Instructions     Your urine test is negative for infection and the pregnancy test is negative I will have you try a cream on the area that bothers you in the mean time until the rest of the test are back.     ED Prescriptions    Medication Sig Dispense Auth. Provider   nystatin cream (MYCOSTATIN) Apply to affected area 2 times daily for   days 30 g Rodriguez-Southworth, Nettie Elm, PA-C     PDMP not reviewed this encounter.   Garey Ham, [place] 03/17/20 1844

## 2020-03-19 LAB — CERVICOVAGINAL ANCILLARY ONLY
Bacterial Vaginitis (gardnerella): NEGATIVE
Candida Glabrata: NEGATIVE
Candida Vaginitis: NEGATIVE
Chlamydia: NEGATIVE
Comment: NEGATIVE
Comment: NEGATIVE
Comment: NEGATIVE
Comment: NEGATIVE
Comment: NEGATIVE
Comment: NORMAL
Neisseria Gonorrhea: NEGATIVE
Trichomonas: NEGATIVE

## 2020-04-30 DIAGNOSIS — M25532 Pain in left wrist: Secondary | ICD-10-CM | POA: Diagnosis not present

## 2020-07-16 DIAGNOSIS — R55 Syncope and collapse: Secondary | ICD-10-CM | POA: Diagnosis not present

## 2020-07-22 ENCOUNTER — Ambulatory Visit
Admission: EM | Admit: 2020-07-22 | Discharge: 2020-07-22 | Disposition: A | Discharge status: Home / Self Care | Payer: BC Managed Care – PPO | Attending: Emergency Medicine | Admitting: Emergency Medicine

## 2020-07-22 DIAGNOSIS — H60333 Swimmer's ear, bilateral: Secondary | ICD-10-CM | POA: Diagnosis not present

## 2020-07-22 DIAGNOSIS — H66001 Acute suppurative otitis media without spontaneous rupture of ear drum, right ear: Secondary | ICD-10-CM | POA: Diagnosis not present

## 2020-07-22 MED ORDER — AMOXICILLIN 500 MG PO CAPS: 1000.0000 mg | ORAL_CAPSULE | Freq: Two times a day (BID) | ORAL | 0 refills | Status: AC

## 2020-07-22 MED ORDER — NEOMYCIN-POLYMYXIN-HC 3.5-10000-1 OT SUSP: 3.0000 [drp] | Freq: Three times a day (TID) | OTIC | 0 refills | Status: AC

## 2020-07-22 NOTE — Discharge Instructions (Addendum)
Begin amoxicillin twice daily for the next week Cortisporin drops 3 times daily x1 week Keep ears clean and dry May use over-the-counter Debrox to help prevent wax buildup May use over-the-counter Flonase for any fluid on ears Follow-up if not improving or worsening

## 2020-07-22 NOTE — ED Triage Notes (Signed)
5 day h/o bilateral ear pain after using a cotton swab to clean her ears. 4 day h/o subjective fever and trouble sleeping. Has been taking DayQuil with relief. Confirms one day of nausea. No v/d. Denies cough and congestion.

## 2020-07-23 NOTE — ED Provider Notes (Signed)
[facility]    CSN: [id number] Arrival date & time: 07/22/20  1818      History   Chief Complaint Chief Complaint  Patient presents with   Otalgia    bilateral    HPI Lisa Walton is a 25 y.o. female presenting today for evaluation of ear discomfort.  Reports over the past week she has developed increased pain and discomfort and itching in bilateral ears.  Reports symptoms started after using a Q-tip.  Denies any drainage.  Reports occasional slight decrease in hearing.  HPI  History reviewed. No pertinent past medical history.  There are no problems to display for this patient.   Past Surgical History:  Procedure Laterality Date   DILATION AND CURETTAGE, DIAGNOSTIC / THERAPEUTIC     NO PAST SURGERIES      OB History     Gravida  1   Para      Term      Preterm      AB  1   Living         SAB      IAB  1   Ectopic      Multiple      Live Births               Home Medications    Prior to Admission medications   Medication Sig Start Date End Date Taking? Authorizing Provider  amoxicillin (AMOXIL) 500 MG capsule Take 2 capsules (1,000 mg total) by mouth 2 (two) times daily for 7 days. 07/22/20 07/29/20 Yes Escher Harr C, PA-C  neomycin-polymyxin-hydrocortisone (CORTISPORIN) 3.5-10000-1 OTIC suspension Place 3 drops into both ears 3 (three) times daily. 07/22/20  Yes Montez Stryker, Junius Creamer, PA-C    Family History History reviewed. No pertinent family history.  Social History Social History   Tobacco Use   Smoking status: Never   Smokeless tobacco: Never  Vaping Use   Vaping Use: Never used  Substance Use Topics   Alcohol use: Not Currently   Drug use: Never     Allergies   Patient has no known allergies.   Review of Systems Review of Systems  Constitutional:  Negative for activity change, appetite change, chills, fatigue and fever.  HENT:  Positive for ear pain. Negative for congestion, rhinorrhea, sinus pressure,  sore throat and trouble swallowing.   Eyes:  Negative for discharge and redness.  Respiratory:  Negative for cough, chest tightness and shortness of breath.   Cardiovascular:  Negative for chest pain.  Gastrointestinal:  Negative for abdominal pain, diarrhea, nausea and vomiting.  Musculoskeletal:  Negative for myalgias.  Skin:  Negative for rash.  Neurological:  Negative for dizziness, light-headedness and headaches.    Physical Exam Triage Vital Signs ED Triage Vitals  Enc Vitals Group     BP 07/22/20 1939 107/66     Pulse Rate 07/22/20 1939 60     Resp 07/22/20 1939 18     Temp 07/22/20 1939 98.2 F (36.8 C)     Temp Source 07/22/20 1939 Oral     SpO2 07/22/20 1939 98 %     Weight --      Height --      Head Circumference --      Peak Flow --      Pain Score 07/22/20 1943 8     Pain Loc --      Pain Edu? --      Excl. in [place]? --    No data  found.  Updated Vital Signs BP 107/66 (BP Location: Left Arm)   Pulse 60   Temp 98.2 F (36.8 C) (Oral)   Resp 18   SpO2 98%   Visual Acuity Right Eye Distance:   Left Eye Distance:   Bilateral Distance:    Right Eye Near:   Left Eye Near:    Bilateral Near:     Physical Exam Vitals and nursing note reviewed.  Constitutional:      Appearance: She is well-developed.     Comments: No acute distress  HENT:     Head: Normocephalic and atraumatic.     Ears:     Comments: Right canal with mild amount of cerumen present blocking visualization of TM, bilateral canals appear slightly erythematous without significant edema  After removal, right TM appears opaque slightly irregular and dull, minimal erythema    Nose: Nose normal.  Eyes:     Conjunctiva/sclera: Conjunctivae normal.  Cardiovascular:     Rate and Rhythm: Normal rate.  Pulmonary:     Effort: Pulmonary effort is normal. No respiratory distress.  Abdominal:     General: There is no distension.  Musculoskeletal:        General: Normal range of motion.      Cervical back: Neck supple.  Skin:    General: Skin is warm and dry.  Neurological:     Mental Status: She is alert and oriented to person, place, and time.     UC Treatments / Results  Labs (all labs ordered are listed, but only abnormal results are displayed) Labs Reviewed - No data to display  EKG   Radiology No results found.  Procedures Procedures (including critical care time)  Medications Ordered in UC Medications - No data to display  Initial Impression / Assessment and Plan / UC Course  I have reviewed the triage vital signs and the nursing notes.  Pertinent labs & imaging results that were available during my care of the patient were reviewed by me and considered in my medical decision making (see chart for details).     Small amount of cerumen removed from right ear to better visualize TM, TM is starting to appear irregular, treating with amoxicillin to cover otitis media as well as Cortisporin topically.  Discussed strict return precautions. Patient verbalized understanding and is agreeable with plan.  Final Clinical Impressions(s) / UC Diagnoses   Final diagnoses:  Acute swimmer's ear of both sides  Non-recurrent acute suppurative otitis media of right ear without spontaneous rupture of tympanic membrane     Discharge Instructions      Begin amoxicillin twice daily for the next week Cortisporin drops 3 times daily x1 week Keep ears clean and dry May use over-the-counter Debrox to help prevent wax buildup May use over-the-counter Flonase for any fluid on ears Follow-up if not improving or worsening     ED Prescriptions     Medication Sig Dispense Auth. Provider   amoxicillin (AMOXIL) 500 MG capsule Take 2 capsules (1,000 mg total) by mouth 2 (two) times daily for 7 days. 28 capsule Camilo Mander C, PA-C   neomycin-polymyxin-hydrocortisone (CORTISPORIN) 3.5-10000-1 OTIC suspension Place 3 drops into both ears 3 (three) times daily. 10 mL  Giacomo Valone, [place]zo [place] C, PA-C      PDMP not reviewed this encounter.   Lew Dawes, [place] 07/23/20 [phone]

## 2020-08-04 ENCOUNTER — Encounter: Payer: Self-pay | Admitting: *Deleted

## 2020-08-05 ENCOUNTER — Telehealth: Payer: Self-pay | Admitting: Diagnostic Neuroimaging

## 2020-08-05 ENCOUNTER — Encounter: Payer: Self-pay | Admitting: Diagnostic Neuroimaging

## 2020-08-05 ENCOUNTER — Ambulatory Visit (INDEPENDENT_AMBULATORY_CARE_PROVIDER_SITE_OTHER): Payer: BC Managed Care – PPO | Admitting: Diagnostic Neuroimaging

## 2020-08-05 VITALS — BP 104/61 | HR 65 | Ht 65.0 in | Wt 164.0 lb

## 2020-08-05 DIAGNOSIS — R569 Unspecified convulsions: Secondary | ICD-10-CM

## 2020-08-05 NOTE — Telephone Encounter (Signed)
MRI brain w/wo contrast [name]: [id number] (08/05/20-09/03/20)  Will schedule at [facility]

## 2020-08-05 NOTE — Progress Notes (Signed)
[facility]  PATIENT: Lisa Walton DOB: Feb 18, 1995  REFERRING CLINICIAN: Pahwani, Kasandra Knudsen, MD HISTORY FROM: patient  REASON FOR VISIT: new consult    HISTORICAL  CHIEF COMPLAINT:  Chief Complaint  Patient presents with   Syncope and collapse    Rm 7 New Pt " my brother found me in bed, my mouth was frothing and I was disoriented, I think it was dehydration or low iron"     HISTORY OF PRESENT ILLNESS:   25 year old female here for evaluation of seizure or syncope.  07/14/2020 patient was at home getting ready for bed when apparently she passed out.  She was found by her family foaming at the mouth.  When she regained consciousness she was somewhat confused.  She had blood pressure of 90/56 and heart rate of 120 which was measured by her father.  She had bit her tongue and her jaw was sore.  1 week prior to this event she was at work when she felt somewhat lightheaded and zoned out and felt slightly confused for a few minutes.  No prior or similar events.  There may be a family member on her father side who had seizures.  No history of meningitis or concussions.   REVIEW OF SYSTEMS: Full 14 system review of systems performed and negative with exception of: as per HPI.    ALLERGIES: No Known Allergies  HOME MEDICATIONS: Outpatient Medications Prior to Visit  Medication Sig Dispense Refill   neomycin-polymyxin-hydrocortisone (CORTISPORIN) 3.5-10000-1 OTIC suspension Place 3 drops into both ears 3 (three) times daily. (Patient not taking: Reported on 08/05/2020) 10 mL 0   No facility-administered medications prior to visit.    PAST MEDICAL HISTORY: Past Medical History:  Diagnosis Date   LOC (loss of consciousness) (HCC)     PAST SURGICAL HISTORY: Past Surgical History:  Procedure Laterality Date   DILATION AND CURETTAGE, DIAGNOSTIC / THERAPEUTIC     NO PAST SURGERIES      FAMILY HISTORY: No family history on file.  SOCIAL HISTORY: Social  History   Socioeconomic History   Marital status: Single    Spouse name: Not on file   Number of children: 0   Years of education: Not on file   Highest education level: Bachelor's degree (e.g., BA, AB, BS)  Occupational History   Not on file  Tobacco Use   Smoking status: Never   Smokeless tobacco: Never  Vaping Use   Vaping Use: Never used  Substance and Sexual Activity   Alcohol use: Not Currently   Drug use: Never   Sexual activity: Yes    Birth control/protection: None  Other Topics Concern   Not on file  Social History Narrative   Lives with parents   No caffeine   Social Determinants of Corporate investment banker Strain: Not on file  Food Insecurity: Not on file  Transportation Needs: Not on file  Physical Activity: Not on file  Stress: Not on file  Social Connections: Not on file  Intimate Partner Violence: Not on file     PHYSICAL EXAM  GENERAL EXAM/CONSTITUTIONAL: Vitals:  Vitals:   08/05/20 0814  BP: 104/61  Pulse: 65  Weight: 164 lb (74.4 kg)  Height: 5\' 5"  (1.651 m)   Body mass index is 27.29 kg/m. Wt Readings from Last 3 Encounters:  08/05/20 164 lb (74.4 kg)  11/25/19 159 lb (72.1 kg)   Patient is in no distress; well developed, nourished and groomed; neck is supple  CARDIOVASCULAR: Examination of carotid arteries is normal; no carotid bruits Regular rate and rhythm, no murmurs Examination of peripheral vascular system by observation and palpation is normal  EYES: Ophthalmoscopic exam of optic discs and posterior segments is normal; no papilledema or hemorrhages No results found.  MUSCULOSKELETAL: Gait, strength, tone, movements noted in Neurologic exam below  NEUROLOGIC: MENTAL STATUS:  No flowsheet data found. awake, alert, oriented to person, place and time recent and remote memory intact normal attention and concentration language fluent, comprehension intact, naming intact fund of knowledge appropriate  CRANIAL NERVE:   2nd - no papilledema on fundoscopic exam 2nd, 3rd, 4th, 6th - pupils equal and reactive to light, visual fields full to confrontation, extraocular muscles intact, no nystagmus 5th - facial sensation symmetric 7th - facial strength symmetric 8th - hearing intact 9th - palate elevates symmetrically, uvula midline 11th - shoulder shrug symmetric 12th - tongue protrusion midline  MOTOR:  normal bulk and tone, full strength in the BUE, BLE  SENSORY:  normal and symmetric to light touch, temperature, vibration  COORDINATION:  finger-nose-finger, fine finger movements normal  REFLEXES:  deep tendon reflexes present and symmetric  GAIT/STATION:  narrow based gait     DIAGNOSTIC DATA (LABS, IMAGING, TESTING) - I reviewed patient records, labs, notes, testing and imaging myself where available.  Lab Results  Component Value Date   WBC 7.8 03/20/2019   HGB 11.4 (L) 03/20/2019   HCT 34.7 (L) 03/20/2019   MCV 85.3 03/20/2019   PLT 220 03/20/2019   No results found for: NA, K, CL, CO2, GLUCOSE, BUN, CREATININE, CALCIUM, PROT, ALBUMIN, AST, ALT, ALKPHOS, BILITOT, GFRNONAA, GFRAA No results found for: CHOL, HDL, LDLCALC, LDLDIRECT, TRIG, CHOLHDL No results found for: MHDQ2I No results found for: VITAMINB12 No results found for: TSH     ASSESSMENT AND PLAN  25 y.o. year old female here with new onset episode of confusion loss of consciousness, suspicious for new onset seizure (07/14/20).  We will proceed with further work-up.  Dx:  1. New onset seizure (HCC)       PLAN:  NEW ONSET SEIZURE (07/14/20)  - check MRI, EEG   - According to [place] law, you can not drive unless you are seizure / syncope free for at least 6 months and under physician's care.   - Please maintain precautions. Do not participate in activities where a loss of awareness could harm you or someone else. No swimming alone, no tub bathing, no hot tubs, no driving, no operating motorized vehicles (cars, ATVs,  motocycles, etc), lawnmowers, power tools or firearms. No standing at heights, such as rooftops, ladders or stairs. Avoid hot objects such as stoves, heaters, open fires. Wear a helmet when riding a bicycle, scooter, skateboard, etc. and avoid areas of traffic. Set your water heater to 120 degrees or less.  Orders Placed This Encounter  Procedures   MR BRAIN W WO CONTRAST   EEG adult   Return for pending test results.    Suanne Marker, MD 08/05/2020, 9:22 AM Certified in Neurology, Neurophysiology and Neuroimaging  [facility] [address], [address] [place], [place] [address] [phone]

## 2020-08-05 NOTE — Patient Instructions (Signed)
NEW ONSET SEIZURE  - check MRI, EEG   - According to [place] law, you can not drive unless you are seizure / syncope free for at least 6 months and under physician's care.   - Please maintain precautions. Do not participate in activities where a loss of awareness could harm you or someone else. No swimming alone, no tub bathing, no hot tubs, no driving, no operating motorized vehicles (cars, ATVs, motocycles, etc), lawnmowers, power tools or firearms. No standing at heights, such as rooftops, ladders or stairs. Avoid hot objects such as stoves, heaters, open fires. Wear a helmet when riding a bicycle, scooter, skateboard, etc. and avoid areas of traffic. Set your water heater to 120 degrees or less.

## 2020-08-12 ENCOUNTER — Telehealth: Payer: Self-pay | Admitting: Diagnostic Neuroimaging

## 2020-08-12 NOTE — Telephone Encounter (Signed)
LVM informing pt of EEG appointment at [facility] on 08/18/20.

## 2020-08-18 ENCOUNTER — Ambulatory Visit (HOSPITAL_COMMUNITY): Payer: BC Managed Care – PPO

## 2020-08-18 DIAGNOSIS — M654 Radial styloid tenosynovitis [de Quervain]: Secondary | ICD-10-CM | POA: Diagnosis not present

## 2020-08-18 DIAGNOSIS — H66001 Acute suppurative otitis media without spontaneous rupture of ear drum, right ear: Secondary | ICD-10-CM | POA: Diagnosis not present

## 2020-08-18 DIAGNOSIS — R569 Unspecified convulsions: Secondary | ICD-10-CM | POA: Diagnosis not present

## 2020-08-19 DIAGNOSIS — H6091 Unspecified otitis externa, right ear: Secondary | ICD-10-CM | POA: Diagnosis not present

## 2020-08-26 ENCOUNTER — Ambulatory Visit (HOSPITAL_COMMUNITY)
Admission: RE | Admit: 2020-08-26 | Discharge: 2020-08-26 | Disposition: A | Discharge status: Home / Self Care | Payer: BC Managed Care – PPO | Source: Ambulatory Visit | Attending: Diagnostic Neuroimaging | Admitting: Diagnostic Neuroimaging

## 2020-08-26 ENCOUNTER — Other Ambulatory Visit: Payer: Self-pay

## 2020-08-26 DIAGNOSIS — R569 Unspecified convulsions: Secondary | ICD-10-CM

## 2020-08-26 NOTE — Progress Notes (Signed)
OP EEG completed at [facility], results pending.

## 2020-09-02 ENCOUNTER — Other Ambulatory Visit: Payer: BC Managed Care – PPO

## 2020-09-24 DIAGNOSIS — L299 Pruritus, unspecified: Secondary | ICD-10-CM | POA: Diagnosis not present

## 2020-09-24 DIAGNOSIS — H6091 Unspecified otitis externa, right ear: Secondary | ICD-10-CM | POA: Diagnosis not present

## 2020-12-09 ENCOUNTER — Telehealth: Payer: Self-pay | Admitting: Diagnostic Neuroimaging

## 2020-12-09 MED ORDER — LEVETIRACETAM 500 MG PO TABS: 500.0000 mg | ORAL_TABLET | Freq: Two times a day (BID) | ORAL | 12 refills | Status: DC

## 2020-12-09 NOTE — Telephone Encounter (Signed)
Pt returned my call and we discussed this message.  Pt sts at midnight she had a seizure like event that lasted for 2 mins witnessed by her dad and sister.  Pt sts she bit her finger and was unable to respond to her family, event was accompanied by muscle spasms.   Pt wanted to know what this could be related to? Per pt she did complete EEG on 08/26/2020 at [facility] hospital, but results were not relayed to her.  Pt no-showed for previously scheduled MRI but would like to repeat this imaging study now.   I advised I would check with MD on EEG and MRI and be back in touch with her.  Pt told me she had not slept well the day before leading up to this event and feels like this could have caused this issue.

## 2020-12-09 NOTE — Telephone Encounter (Signed)
I called patient. She had another witnessed seizure last night (during sleep, midnight). Shaking, spitting blood, cheek sore, finger injured. Doing better today, but still tired.   - start levetiracetam 500mg  twice a day  - follow up EEG results  - follow up MRI brain (ordered, not done)  Meds ordered this encounter  Medications   levETIRAcetam (KEPPRA) 500 MG tablet    Sig: Take 1 tablet (500 mg total) by mouth 2 (two) times daily.    Dispense:  60 tablet    Refill:  12   , MD 12/09/2020, 5:41 PM Certified in Neurology, Neurophysiology and Neuroimaging  [facility] [address] [place], [place] [phone]

## 2020-12-09 NOTE — Telephone Encounter (Signed)
I called the pt and left a vm on this.

## 2020-12-09 NOTE — Telephone Encounter (Signed)
Pt called states she had a seizure last night and wants to know if its epilepsy. Pt requesting a call back.

## 2020-12-10 NOTE — Telephone Encounter (Signed)
LVM for pt to call back to schedule   updated [employer] auth: [id number] (exp. 12/10/20 to 01/08/21)

## 2020-12-16 NOTE — Telephone Encounter (Signed)
Patient returned my call she is scheduled at [facility] for 12/30/20.  

## 2020-12-30 ENCOUNTER — Ambulatory Visit (INDEPENDENT_AMBULATORY_CARE_PROVIDER_SITE_OTHER): Payer: BC Managed Care – PPO

## 2020-12-30 ENCOUNTER — Other Ambulatory Visit: Payer: BC Managed Care – PPO

## 2020-12-30 DIAGNOSIS — R569 Unspecified convulsions: Secondary | ICD-10-CM

## 2020-12-30 MED ORDER — GADOBENATE DIMEGLUMINE 529 MG/ML IV SOLN
15.0000 mL | Freq: Once | INTRAVENOUS | Status: AC | PRN
  Administered 2020-12-30: 15 mL via INTRAVENOUS

## 2021-01-04 ENCOUNTER — Telehealth: Payer: Self-pay | Admitting: Diagnostic Neuroimaging

## 2021-01-04 NOTE — Telephone Encounter (Signed)
Pt called wanting to know when she will be called with her MRI results. Pt states she is available after 4pm.

## 2021-01-04 NOTE — Telephone Encounter (Signed)
Penumalli, Glenford Bayley, MD  You 5 minutes ago (4:48 PM)   Mri ok. Continue levetiracetam. -VRP   Pt advised of results and verbalized understanding.

## 2021-03-08 ENCOUNTER — Telehealth: Payer: Self-pay | Admitting: Diagnostic Neuroimaging

## 2021-03-08 NOTE — Telephone Encounter (Signed)
Called patient who stated she has been taking levetiracetam but gets stomach ache every time. She recently refilled it and stated the pill looks different. After some discussion she stated she wonders if she is pregnant.. I advised she find out for certain. If she is not, she is to call us back.  I advised she not stop taking levetiracetam. Patient verbalized understanding, appreciation.

## 2021-03-08 NOTE — Telephone Encounter (Signed)
Pt called stating that she is needing to discuss her levETIRAcetam (KEPPRA) 500 MG tablet with the provider or RN. Pt states every time she takes it she gets sick and starts having stomach cramps. Please advise.

## 2021-03-18 ENCOUNTER — Encounter: Payer: Self-pay | Admitting: *Deleted

## 2021-03-18 NOTE — Telephone Encounter (Signed)
Pt has called to report that she is not pregnant, please call pt back.

## 2021-03-18 NOTE — Telephone Encounter (Signed)
Called patient who stated she stopped taking keppra after our conversation on 03/08/21. She has no more stomach pains, stated she had them every time she took it. She also had taken OTC Plan B to prevent pregnancy at that time and wondered if that caused her stomach issues.  I asked about seizure activity, she reported Zoning out, panic attacks  once a week at bedtime or with stress. Last one occurred last Sat. I informed her I'll send to Dr Marjory Lies, will call her next week. Patient verbalized understanding, appreciation.

## 2021-03-24 ENCOUNTER — Encounter: Payer: Self-pay | Admitting: *Deleted

## 2021-03-24 MED ORDER — LAMOTRIGINE 25 MG PO TABS: ORAL_TABLET | ORAL | 3 refills | Status: AC

## 2021-03-24 NOTE — Telephone Encounter (Signed)
Stay off keppra (because of patient side effects of stomach pain).  Start lamotrigine.  Meds ordered this encounter  Medications   lamoTRIgine (LAMICTAL) 25 MG tablet    Sig: Take 25mg  daily for 2 weeks; then take 25mg  twice a day for 2 weeks; then take 50mg  twice a day for 2 weeks; then take 75mg  twice a day for 2 weeks; then 100mg  twice a day    Dispense:  240 tablet    Refill:  3   , MD 03/24/2021, 2:12 PM Certified in Neurology, Neurophysiology and Neuroimaging  [facility] [address], [address] [place], Suanne Marker 03/26/2021 [phone]

## 2021-03-24 NOTE — Telephone Encounter (Signed)
Called patient and advised her to stop keppra which she has already done. Advised Dr Marjory Lies has sent in a new Rx for lamotrigine with instructions to titrate dose up over weeks. She asked which pharmacy Rx was sent. [facility] [address] [place]. [facility].  I advised I'll send instructions to her my chart as well, and she should call with any concerns, questions. Patient verbalized understanding, appreciation. My chart sent.

## 2021-03-24 NOTE — Addendum Note (Signed)
Addended by: Joycelyn Schmid R on: 03/24/2021 02:12 PM   Modules accepted: Orders

## 2021-12-28 ENCOUNTER — Other Ambulatory Visit: Payer: Self-pay | Admitting: Diagnostic Neuroimaging

## 2022-01-22 IMAGING — US US OB COMP LESS 14 WK
1 series · 15 of 28 positions shown · non-contrast
Comparison: None.

CLINICAL DATA: Abdominal pain, beta HCG [DATE], gravida 1 para 0 A0

EXAM:
OBSTETRIC <14 WK ULTRASOUND
TECHNIQUE: Transabdominal ultrasound was performed for evaluation of the
gestation as well as the maternal uterus and adnexal regions.

[Series 1: us ob comp less 14 wk · 15 of 51 slices shown]
[im 1/51]
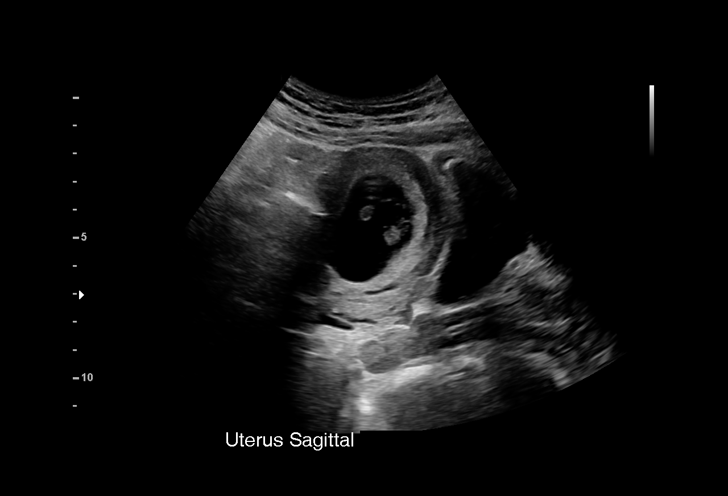
[im 4/51]
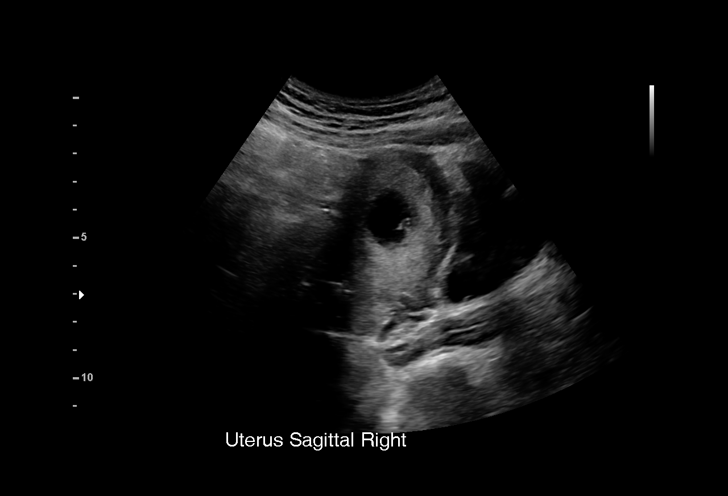
[im 8/51]
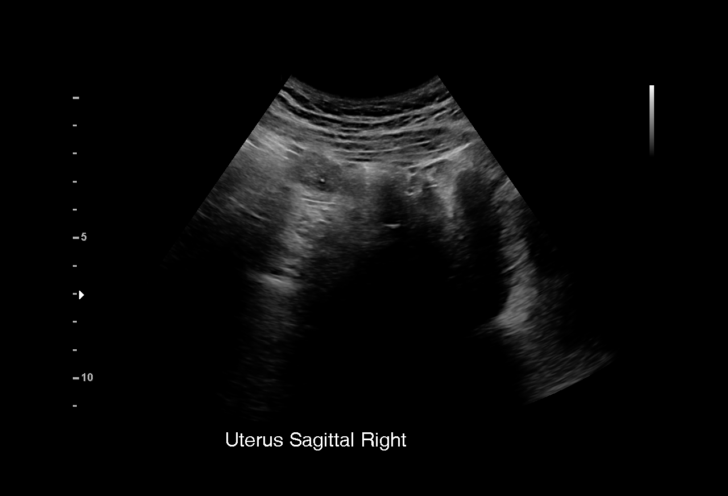
[im 12/51]
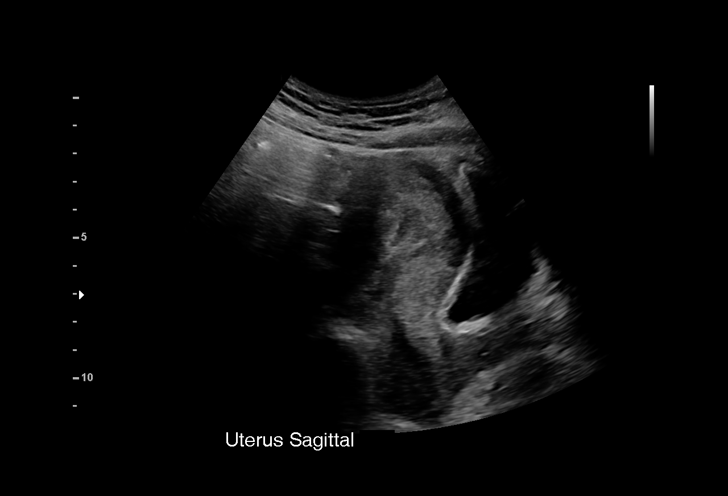
[im 15/51]
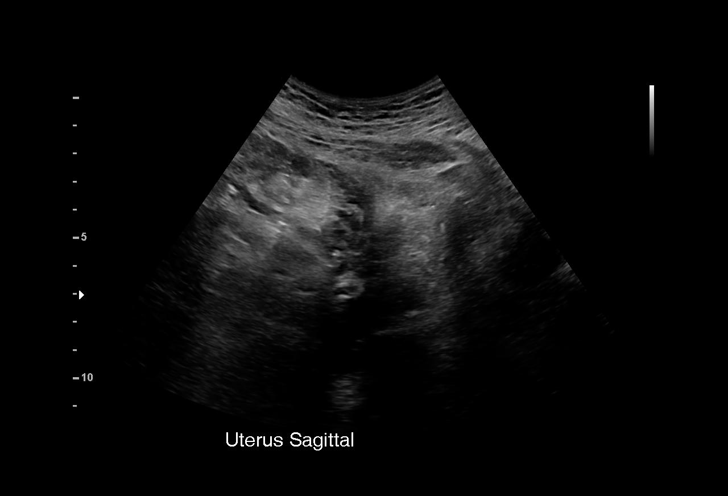
[im 19/51]
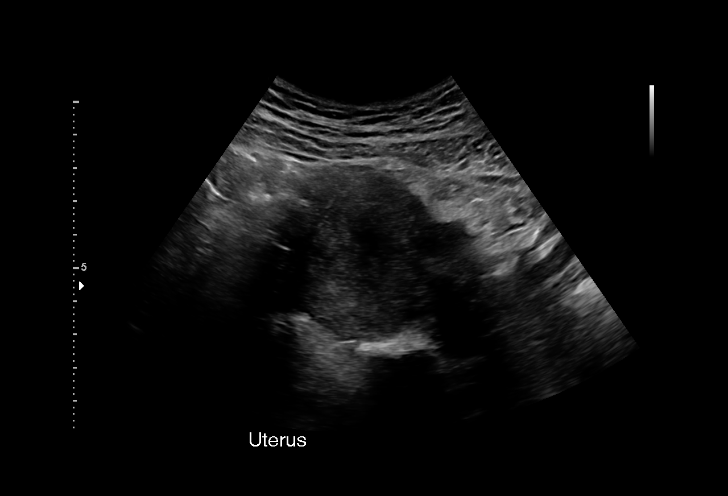
[im 23/51]
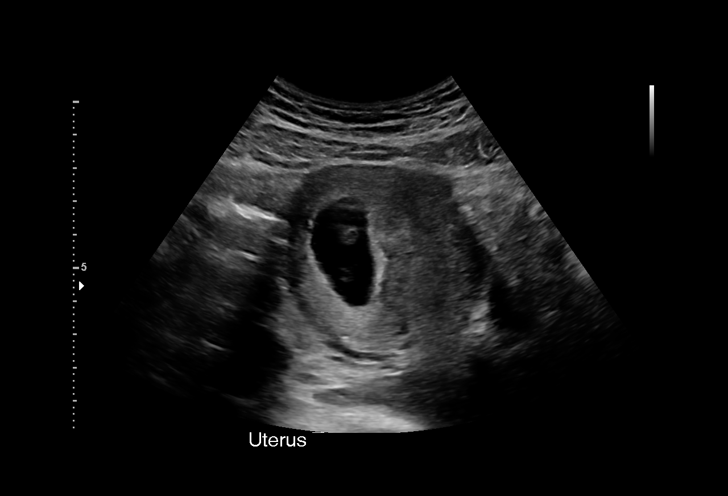
[im 26/51]
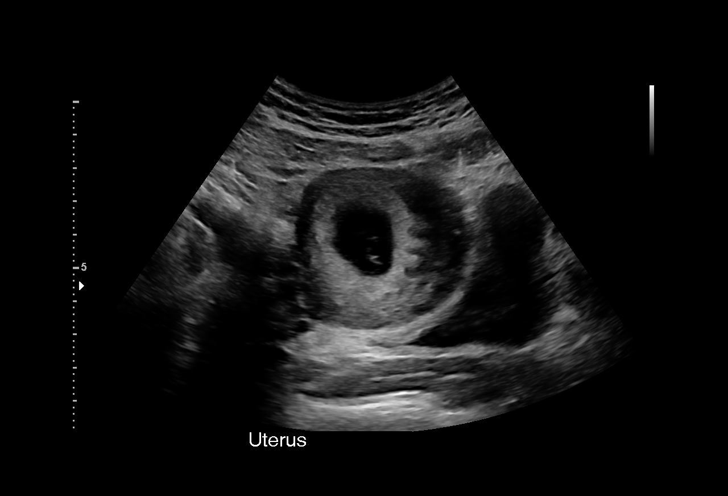
[im 28/51]
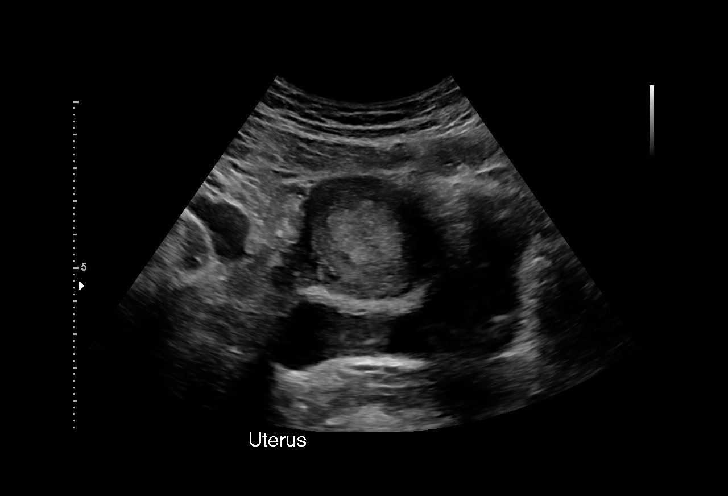
[im 32/51]
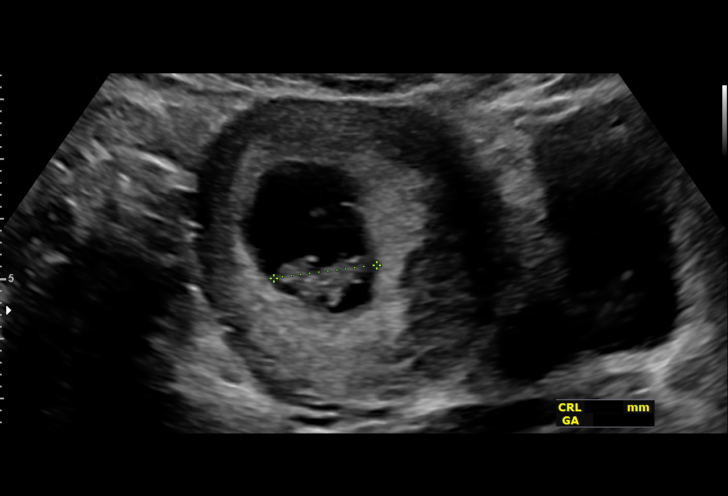
[im 36/51]
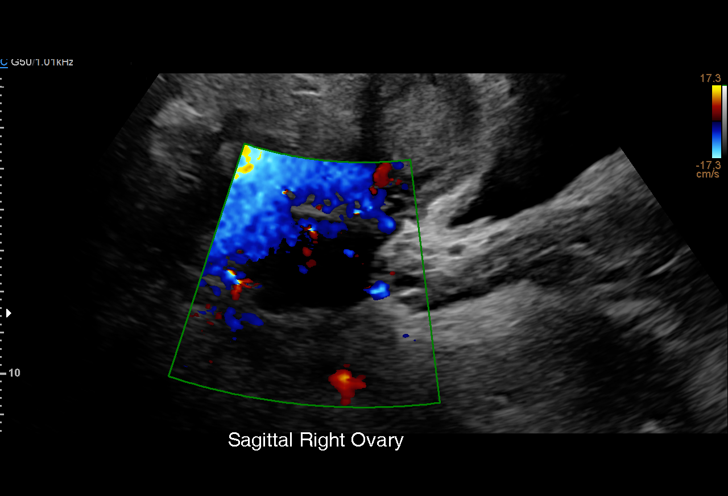
[im 39/51]
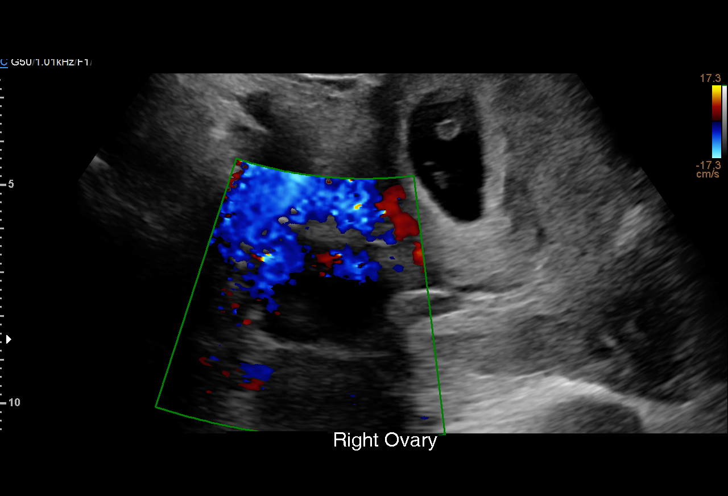
[im 43/51]
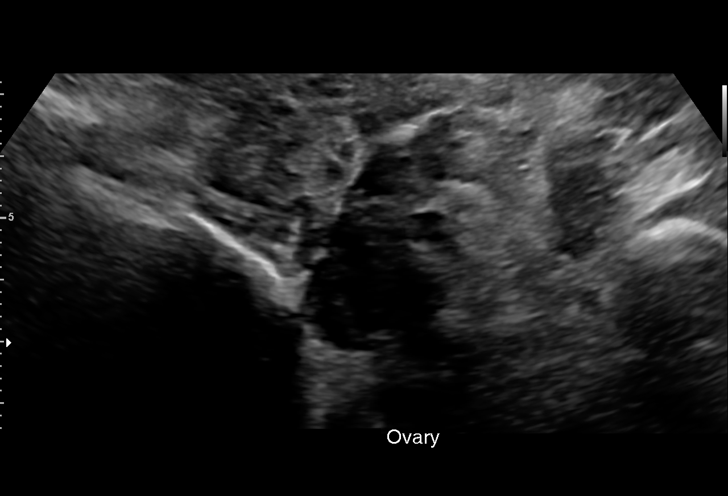
[im 47/51]
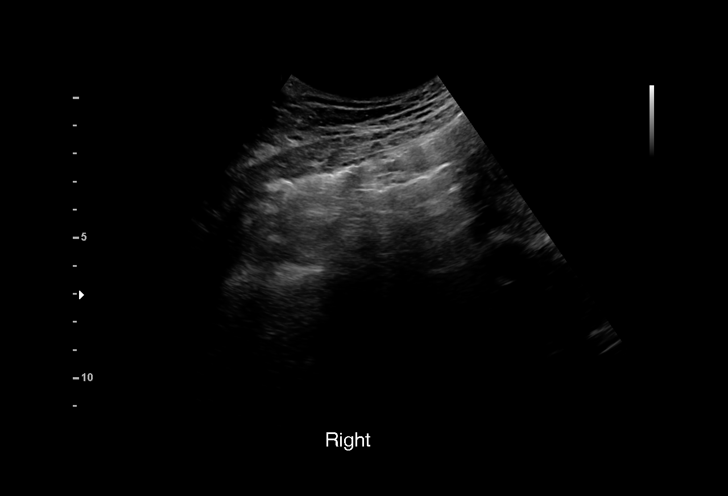
[im 51/51]
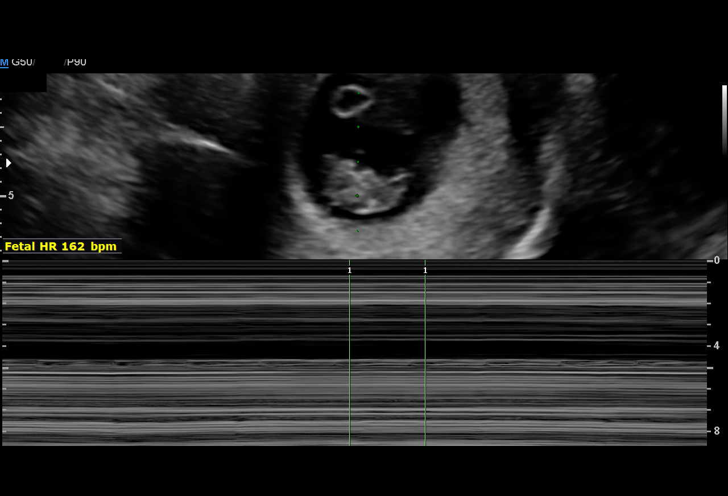

[15 of 28 positions shown; findings below may reference images not displayed]

FINDINGS: Intrauterine gestational sac: Single

Yolk sac:  Visualized.

Embryo:  Visualized.

Cardiac Activity: Visualized.

Heart Rate: 162 bpm

CRL:   17 mm   8 w 1 d                  US EDC: 10/30/2019

Subchorionic hemorrhage:  None visualized.

Maternal uterus/adnexae: Left ovary measures 2.4 x 2.4 x 1.5 cm in
the right ovary measures 4.9 x 5.0 x 3.0 cm. Likely corpus luteum
cyst within the right ovary measuring approximately 3 cm. Normal
color flow Doppler within the bilateral ovaries. No free fluid or
pelvic mass.
IMPRESSION: 1. Single live intrauterine pregnancy as above, estimated age 8
weeks and 1 day.

## 2023-03-30 ENCOUNTER — Other Ambulatory Visit: Payer: Self-pay | Admitting: Diagnostic Neuroimaging

## 2023-03-30 NOTE — Telephone Encounter (Signed)
Last seen on 08/05/20  I called and left a detailed message on pt voicemail to call and schedule a visit with our office last seen in 2022. The last Rx was filled by [facility] which appears to be located in [place], [place], [facility] [place] [facility].   I told her I would send in a short supply so she won't be out of medication but she needs to schedule a visit here or with the [facility] provider.

## 2023-03-30 NOTE — Telephone Encounter (Signed)
Pt called wanting to know when this will be called in. Please advise.
